# Patient Record
Sex: Female | Born: 1980 | Race: White | Hispanic: No | Marital: Single
Health system: Midwestern US, Academic
[De-identification: ages and names within clinical notes are randomized; demographics above are authoritative.]

---

## 2020-05-28 IMAGING — MR Knee^Routine
5 series · 39 of 40 positions shown · non-contrast
Comparison: none

[Series 3: T2 fat-sat · axial · 4.0mm · 0.50mm/px · z∈[-68,+33]mm · 5 of 6 slices shown (1 of 3)]
[im 1/6]
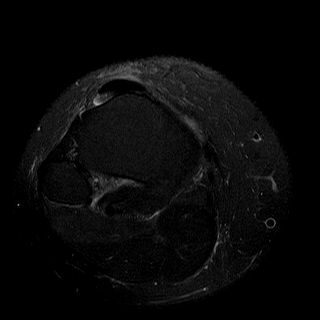
[im 2/6]
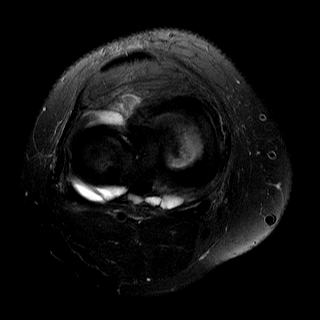
[im 3/6]
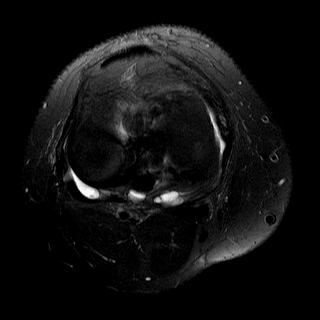
[im 4/6]
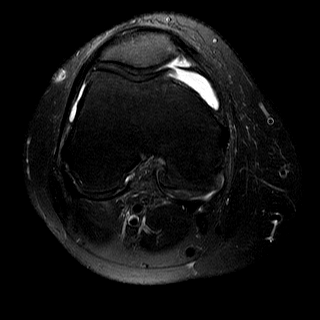
[im 6/6]
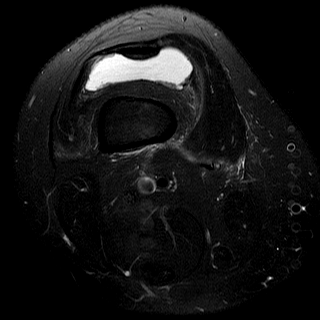

[Series 4: T1 · axial · 4.0mm · 0.62mm/px · z∈[-94,+38]mm · 9 of 12 slices shown]
[im 1/12]
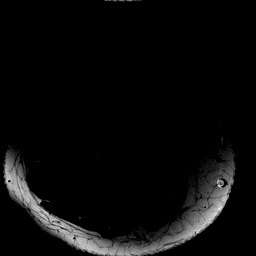
[im 2/12]
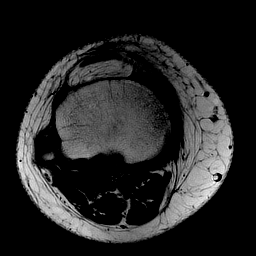
[im 3/12]
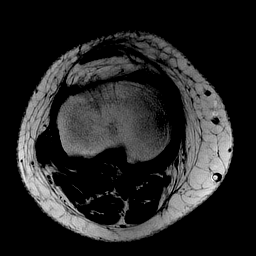
[im 4/12]
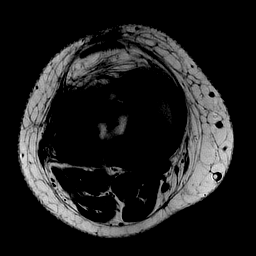
[im 5/12]
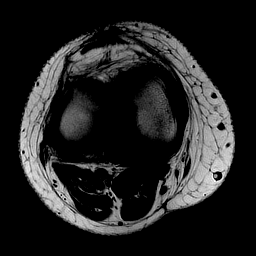
[im 7/12]
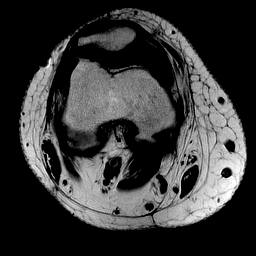
[im 8/12]
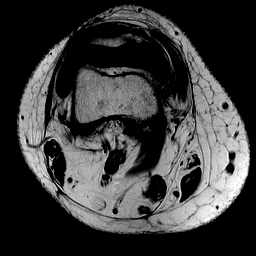
[im 10/12]
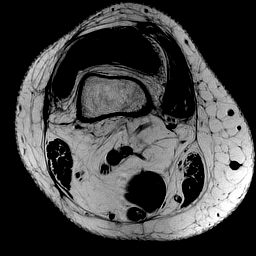
[im 12/12]
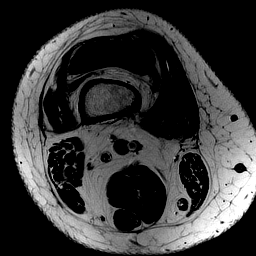

[Series 5: T2 fat-sat · coronal · 3.5mm · 0.50mm/px · 9 of 11 slices shown (2 of 3)]
[im 1/11]
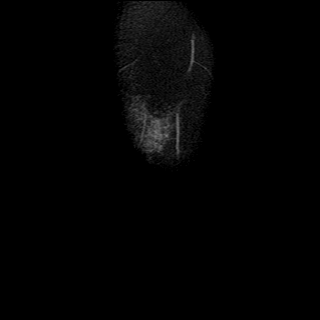
[im 2/11]
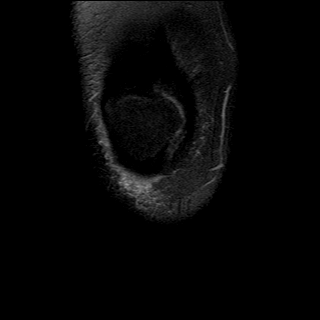
[im 3/11]
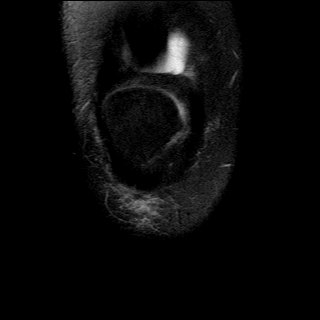
[im 4/11]
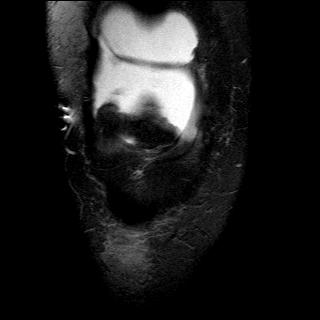
[im 6/11]
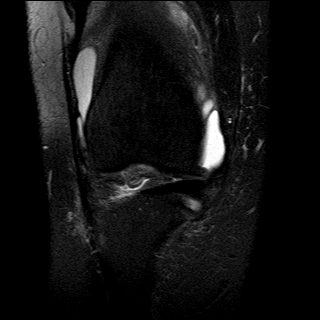
[im 7/11]
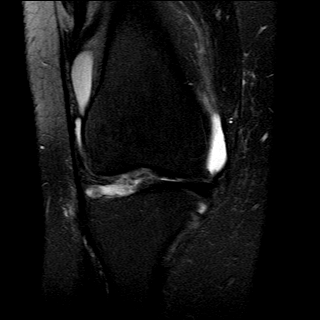
[im 8/11]
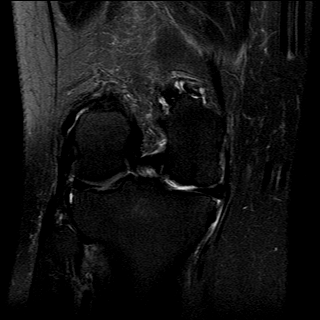
[im 9/11]
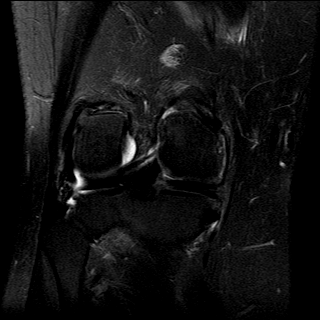
[im 11/11]
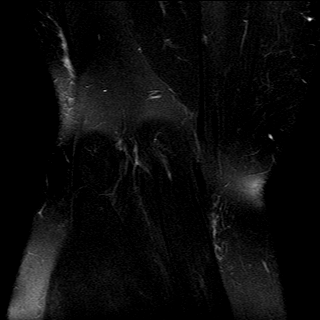

[Series 6: T2 fat-sat · sagittal · 4.0mm · 0.50mm/px · 8 of 9 slices shown (3 of 3)]
[im 1/9]
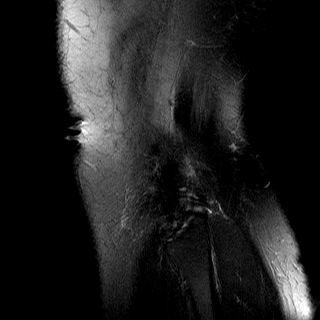
[im 2/9]
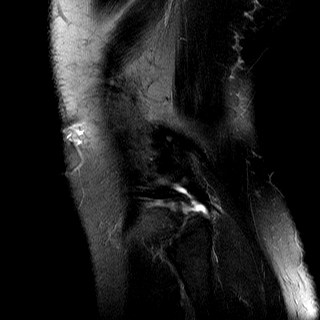
[im 3/9]
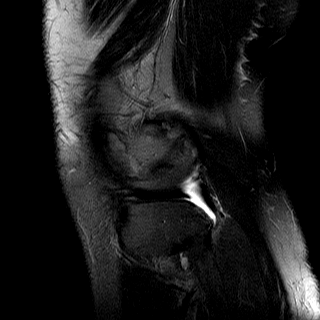
[im 4/9]
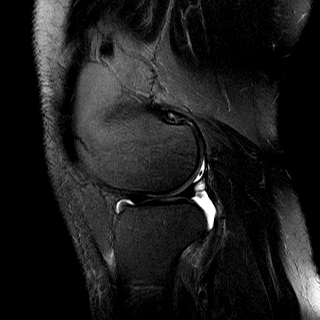
[im 5/9]
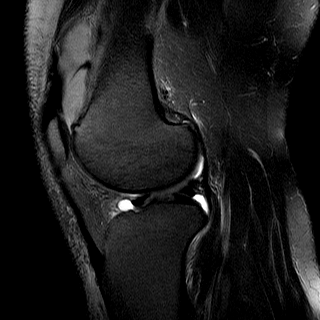
[im 6/9]
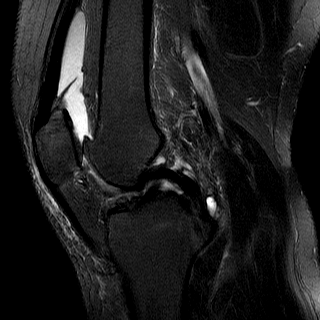
[im 7/9]
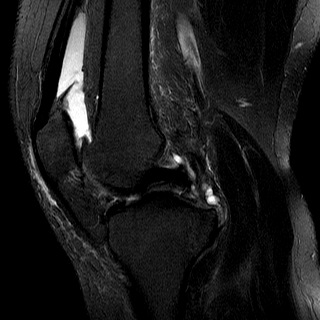
[im 9/9]
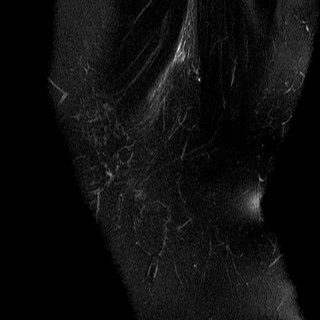

[Series 7: PD · sagittal · 4.0mm · 0.29mm/px · 8 of 10 slices shown]
[im 1/10]
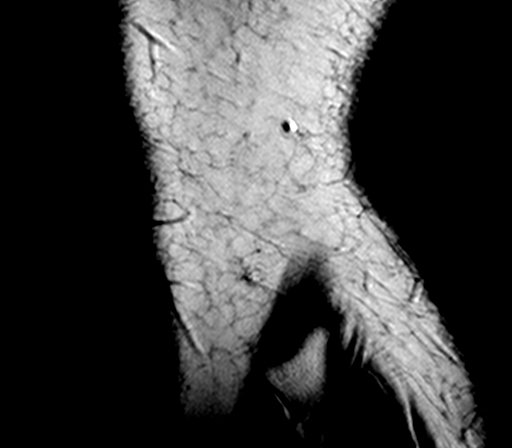
[im 2/10]
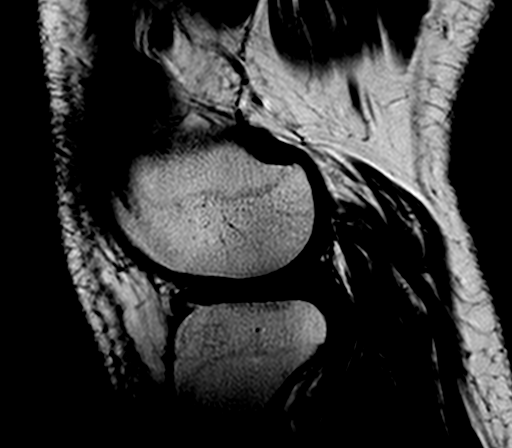
[im 3/10]
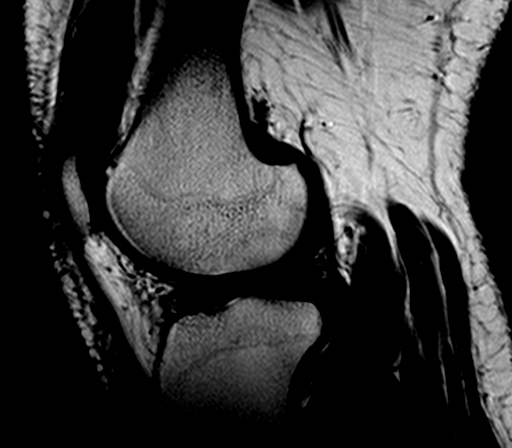
[im 4/10]
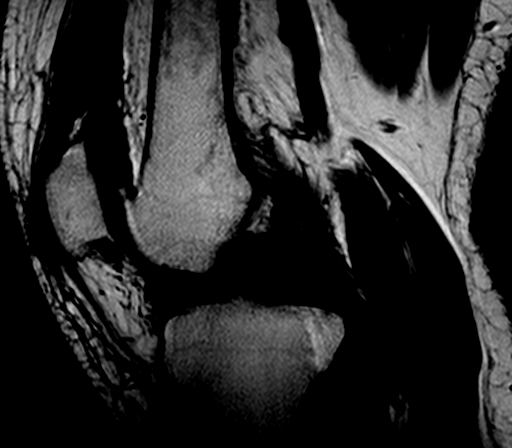
[im 6/10]
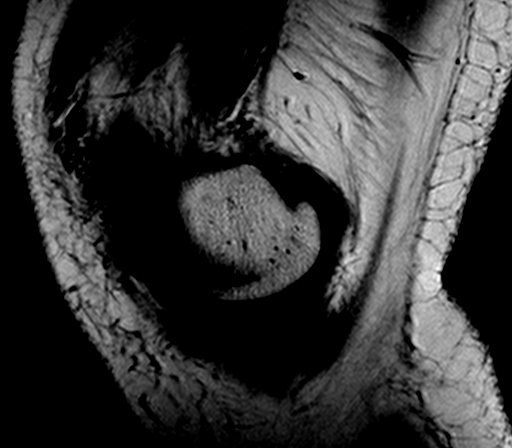
[im 7/10]
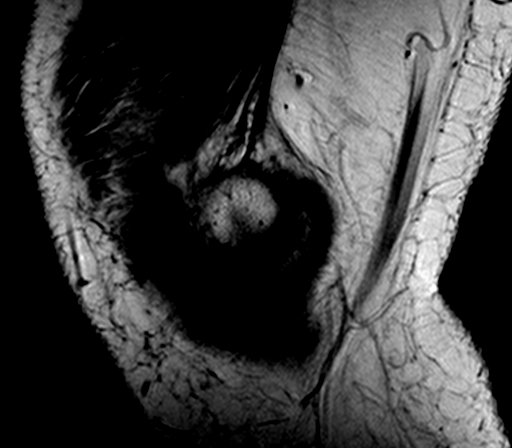
[im 8/10]
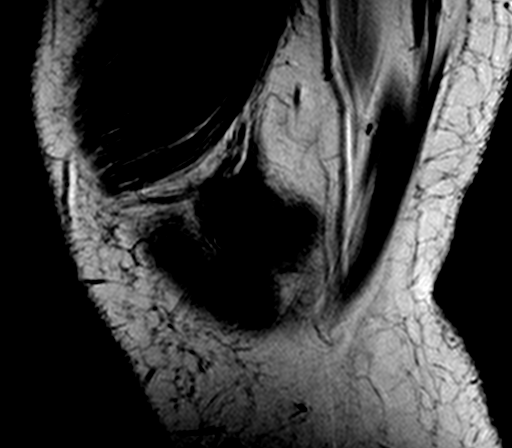
[im 10/10]
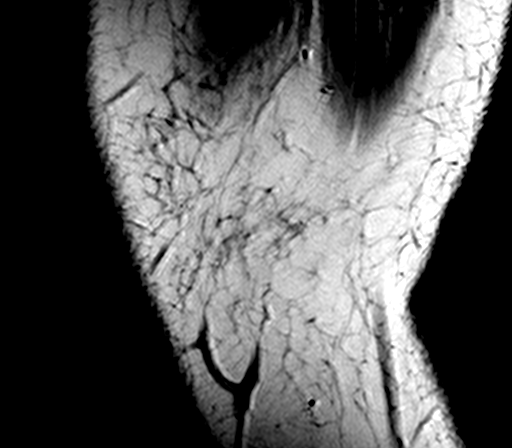

[39 of 40 positions shown; findings below may reference images not displayed]

EXAM

MR knee RT wo con

INDICATION

Knee pain

TECHNIQUE

MRI of the knee was performed. Sequences include coronal, sagittal, and axial planes with fast spin
echo technique, both in fat saturated and non-fat saturated. No intravenous contrast was
administered.

COMPARISONS

None available at the time of dictation.

FINDINGS

MEDIAL COMPARTMENT: Intact medial meniscus. Near full-thickness broad based cartilage loss at the
central weight-bearing medial femoral condyle without subchondral edema (series 6, image 18). Broad-
based near full-thickness cartilage loss of the medial tibial plateau.

LATERAL COMPARTMENT: Intact lateral meniscus. No focal chondrosis or subchondral edema.

PATELLOFEMORAL COMPARTMENT: Trace cartilage fibrillation over the lateral patellar facet.

CRUCIATE LIGAMENTS: Intact.

MEDIAL SUPPORTING STRUCTURES: Intact superficial and deep medial collateral ligaments.

LATERAL SUPPORTING STRUCTURES: Intact iliotibial band, lateral capsular ligament, lateral
collateral ligament, biceps femoris tendon, and popliteus tendon.

EXTENSOR MECHANISM: Intact.

JOINT SPACE/FLUID: Medium-sized knee joint effusion. Prominent superior plica. Trace amount of
possible synovitis in the suprapatellar recess. No Baker's cyst. Possible developing ganglion cyst
versus posterior medial joint recess distension measuring up to 2 cm (series 5, image 25).

BONES: No acute fracture, osseous contusion, or aggressive focal osseous lesion.

MUSCLES: Normal signal intensity and morphology.

NEUROVASCULAR: Normal popliteal neurovascular bundle.

IMPRESSION
1. Tricompartmental osteoarthrosis with medial weight-bearing predominant degenerative change.
2. Medium-sized knee joint effusion with internal synovitis.
3. No definitive meniscal or cruciate ligamentous injury.

Tech Notes:

RIGHT KNEE PAIN, S/P INJURY WHILE WALKING DOWN STAIRS IN February 2020, PT STATES SHE FELT POP IN KNEE
WHEN IT HAPPENED, LOCKS UP

## 2021-01-09 IMAGING — MR Knee^Routine
5 series · 20 of 20 positions shown · non-contrast
Comparison: none

[Series 3: T2 fat-sat · axial · 4.0mm · 0.50mm/px · 1 of 1 slices shown (1 of 2)]
[im 1/1]
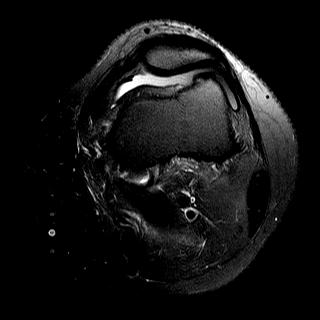

[Series 4: T1 · axial · 4.0mm · 0.62mm/px · z∈[-80,+20]mm · 4 of 4 slices shown]
[im 1/4]
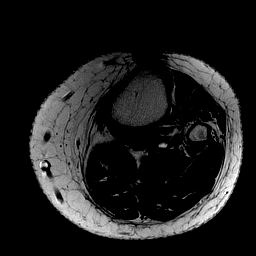
[im 2/4]
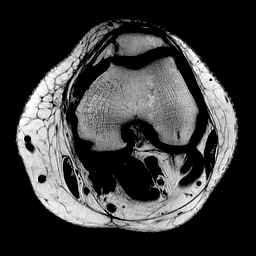
[im 3/4]
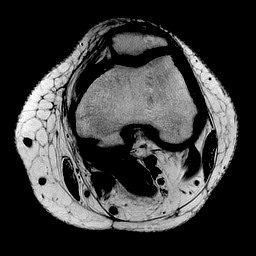
[im 4/4]
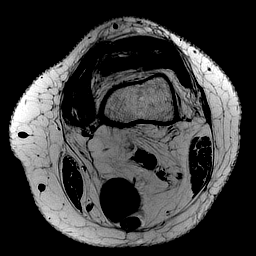

[Series 6: T2 fat-sat · sagittal · 4.0mm · 0.50mm/px · 7 of 7 slices shown (2 of 2)]
[im 1/7]
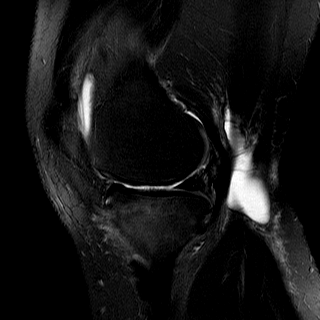
[im 2/7]
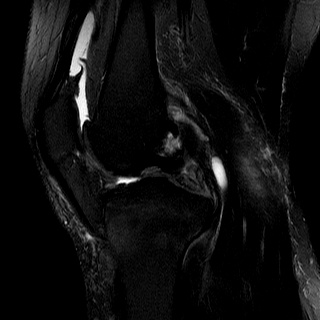
[im 3/7]
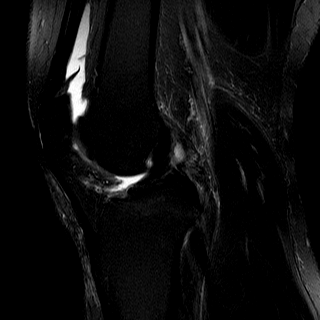
[im 4/7]
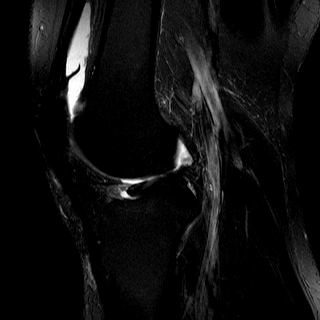
[im 5/7]
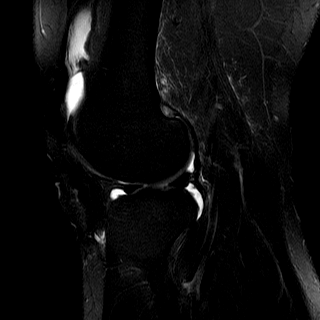
[im 6/7]
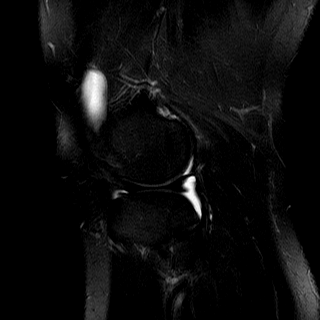
[im 7/7]
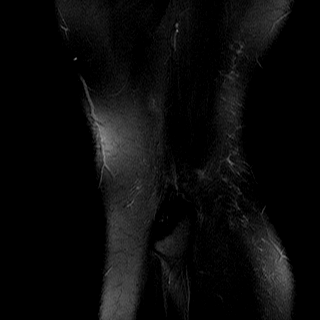

[Series 7: PD · sagittal · 4.0mm · 0.29mm/px · 7 of 7 slices shown]
[im 1/7]
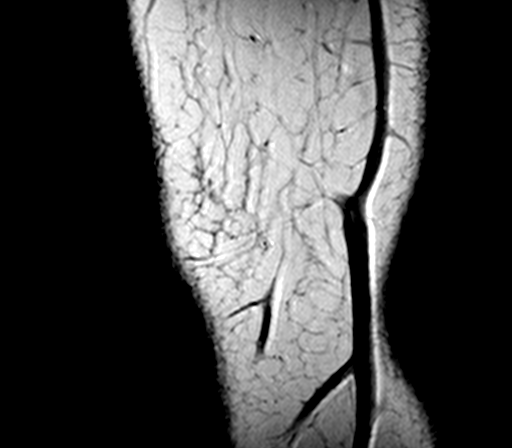
[im 2/7]
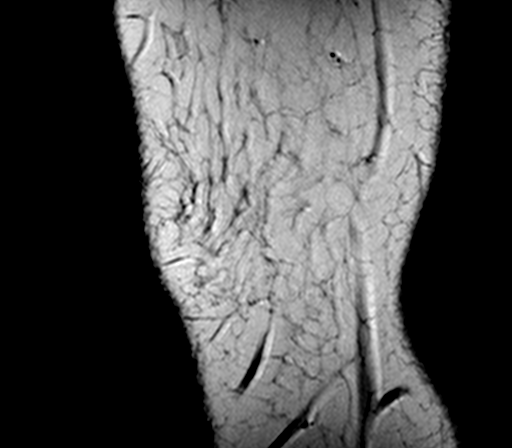
[im 3/7]
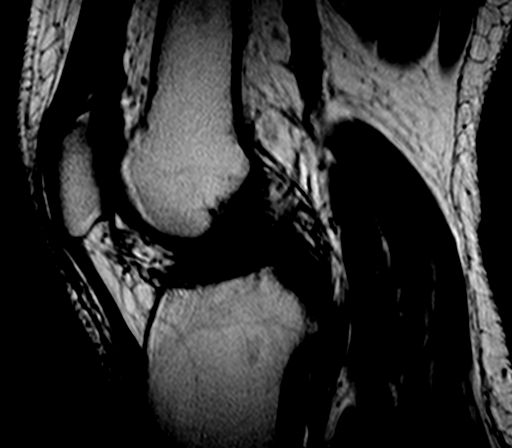
[im 4/7]
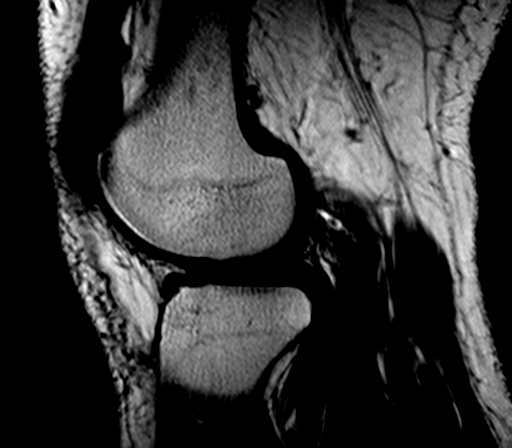
[im 5/7]
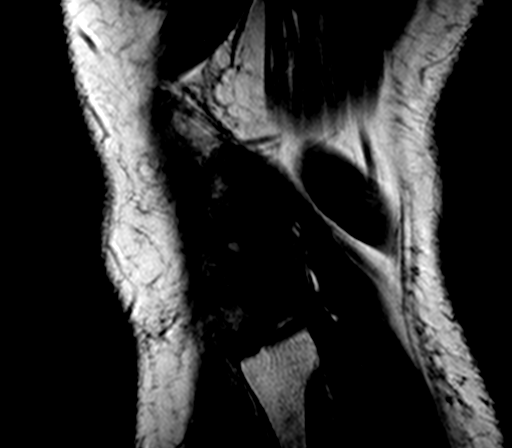
[im 6/7]
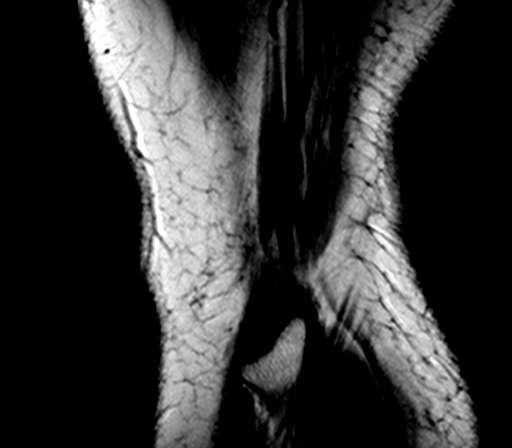
[im 7/7]
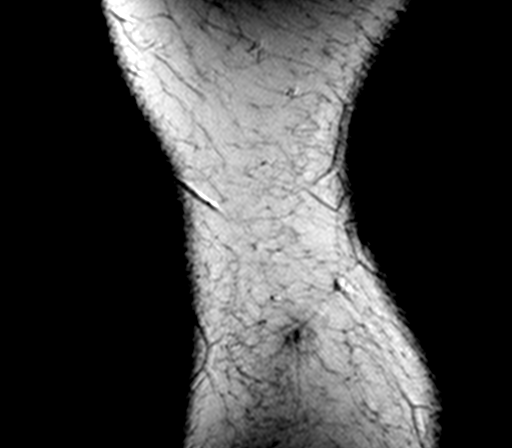

[Series 8: STIR · axial · 4.0mm · 0.29mm/px · 1 of 1 slices shown]
[im 1/1]
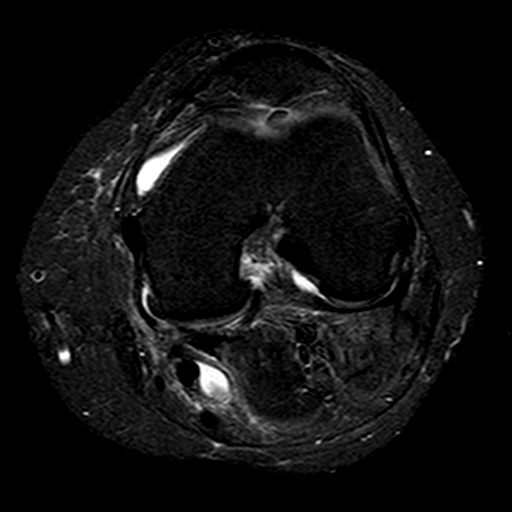

[20 of 20 positions shown; findings below may reference images not displayed]

EXAM

MR knee LT wo con

INDICATION

Knee pain

TECHNIQUE

MRI of the knee was performed. Sequences include coronal, sagittal, and axial planes with fast spin
echo technique, both in fat saturated and non-fat saturated. No intravenous contrast was
administered.

COMPARISONS

None available at the time of dictation.

FINDINGS

MEDIAL COMPARTMENT: Peripheral [DATE] posterior horn to posterior root tear (series 6, image 12).
Broad near full-thickness to full-thickness cartilage loss of the central weight-bearing medial
femoral condyle and opposing medial tibial plateau. Subchondral edema of the medial tibial plateau
with a small focal area of subchondral insufficiency fracture with T1 hypointense line (series 5,

LATERAL COMPARTMENT: Intact lateral meniscus. No focal chondrosis or subchondral edema.

PATELLOFEMORAL COMPARTMENT: No focal chondrosis or subchondral edema.

CRUCIATE LIGAMENTS: Intact.

MEDIAL SUPPORTING STRUCTURES: Intact superficial and deep medial collateral ligaments.

LATERAL SUPPORTING STRUCTURES: Intact iliotibial band, lateral capsular ligament, lateral
collateral ligament, biceps femoris tendon, and popliteus tendon.

EXTENSOR MECHANISM: Intact.

JOINT SPACE/FLUID: Small to medium knee joint effusion. Medium-sized Baker's cyst with slight
internal complexity. Soft tissue edema anterior to the anterior medial proximal tibia. This is
likely reactive.

BONES: Subchondral insufficiency fracture of the medial tibial plateau.

MUSCLES: Normal signal intensity and morphology.

NEUROVASCULAR: Normal popliteal neurovascular bundle.

IMPRESSION
1. Subchondral insufficiency fracture of the medial tibial plateau with surrounding soft tissue
edema and underlying proximal tibial bone marrow edema.
2. Medium-sized knee joint effusion.
3. Peripheral [DATE] posterior horn to posterior root tear of the medial meniscus.

Tech Notes:

POSTERIOR LEFT KNEE PAIN AFTER HEARING A POP 1 MONTHS AGO, NOW LATERAL KNEE PAIN.  RG

## 2021-10-01 ENCOUNTER — Encounter: Admit: 2021-10-01 | Discharge: 2021-10-01 | Payer: BC Managed Care – PPO

## 2021-10-02 ENCOUNTER — Encounter: Admit: 2021-10-02 | Discharge: 2021-10-02 | Payer: BC Managed Care – PPO

## 2021-10-02 DIAGNOSIS — M25561 Pain in right knee: Secondary | ICD-10-CM

## 2021-10-06 ENCOUNTER — Encounter: Admit: 2021-10-06 | Discharge: 2021-10-06 | Payer: BC Managed Care – PPO

## 2021-10-23 ENCOUNTER — Encounter: Admit: 2021-10-23 | Discharge: 2021-10-23 | Payer: BC Managed Care – PPO

## 2021-10-23 DIAGNOSIS — M25561 Pain in right knee: Secondary | ICD-10-CM

## 2021-10-26 ENCOUNTER — Encounter: Admit: 2021-10-26 | Discharge: 2021-10-26 | Payer: BC Managed Care – PPO

## 2021-10-27 ENCOUNTER — Encounter: Admit: 2021-10-27 | Discharge: 2021-10-27 | Payer: BC Managed Care – PPO

## 2021-10-27 ENCOUNTER — Ambulatory Visit: Admit: 2021-10-27 | Discharge: 2021-10-27 | Payer: BC Managed Care – PPO

## 2021-10-27 DIAGNOSIS — F32A Depression: Secondary | ICD-10-CM

## 2021-10-27 DIAGNOSIS — M25561 Pain in right knee: Secondary | ICD-10-CM

## 2021-10-27 DIAGNOSIS — E785 Hyperlipidemia, unspecified: Secondary | ICD-10-CM

## 2021-10-27 DIAGNOSIS — F419 Anxiety disorder, unspecified: Secondary | ICD-10-CM

## 2021-10-27 DIAGNOSIS — M94261 Chondromalacia, right knee: Secondary | ICD-10-CM

## 2021-10-27 DIAGNOSIS — G43909 Migraine, unspecified, not intractable, without status migrainosus: Secondary | ICD-10-CM

## 2021-10-27 DIAGNOSIS — R7303 Prediabetes: Secondary | ICD-10-CM

## 2021-10-27 DIAGNOSIS — G4700 Insomnia, unspecified: Secondary | ICD-10-CM

## 2021-10-27 DIAGNOSIS — M21161 Varus deformity, not elsewhere classified, right knee: Secondary | ICD-10-CM

## 2021-10-27 DIAGNOSIS — K219 Gastro-esophageal reflux disease without esophagitis: Secondary | ICD-10-CM

## 2021-11-06 ENCOUNTER — Encounter: Admit: 2021-11-06 | Discharge: 2021-11-06 | Payer: BC Managed Care – PPO

## 2021-11-11 ENCOUNTER — Encounter: Admit: 2021-11-11 | Discharge: 2021-11-11 | Payer: BC Managed Care – PPO

## 2021-11-17 ENCOUNTER — Encounter: Admit: 2021-11-17 | Discharge: 2021-11-17 | Payer: BC Managed Care – PPO

## 2021-11-17 ENCOUNTER — Ambulatory Visit: Admit: 2021-11-17 | Discharge: 2021-11-17 | Payer: BC Managed Care – PPO

## 2021-11-17 DIAGNOSIS — F419 Anxiety disorder, unspecified: Secondary | ICD-10-CM

## 2021-11-17 DIAGNOSIS — K219 Gastro-esophageal reflux disease without esophagitis: Secondary | ICD-10-CM

## 2021-11-17 DIAGNOSIS — E785 Hyperlipidemia, unspecified: Secondary | ICD-10-CM

## 2021-11-17 DIAGNOSIS — M94261 Chondromalacia, right knee: Secondary | ICD-10-CM

## 2021-11-17 DIAGNOSIS — R7303 Prediabetes: Secondary | ICD-10-CM

## 2021-11-17 DIAGNOSIS — G43909 Migraine, unspecified, not intractable, without status migrainosus: Secondary | ICD-10-CM

## 2021-11-17 DIAGNOSIS — F32A Depression: Secondary | ICD-10-CM

## 2021-11-17 DIAGNOSIS — G4700 Insomnia, unspecified: Secondary | ICD-10-CM

## 2021-11-17 DIAGNOSIS — M21161 Varus deformity, not elsewhere classified, right knee: Secondary | ICD-10-CM

## 2021-11-17 NOTE — Progress Notes
Patient arrived for scheduled appointment today for gel injections into her right knee. Clinic RN had called and left a voicemail to the number listed on her chart and left voicemail x2 about denial of Jcode medication for injection.   This RN went to the waiting room and explained in detail to patient about her denied injection through insurance. Patient was understandably upset. Offered patient the Euflexxa and Supartz patient pay options. Patient states that she can not afford those prices. She got very upset and states "someone is going to have replace my fucking knee then, I can not wait another 10 years for something to be done, I cant keep living in pain".  Patient states that she came down here for a second opinion since the other total joint surgeon refused to replace her knee as well.  Explained to the patient that working with a total joint surgeon the success rate of total knee replacements under the age of 66 is very minimal and explained all the risk of having a knee replacement at such young age. "Someone has to do fucking something because my insurance wont help me, you wont help me and I cant wait 10 years in this pain"   Explained to the patient that I was understanding of her pain and that I was very sorry that I could not offer any more help from my end as a nurse.  I offered for patient to still come back and talk with Dr. Essie Christine about options since her insurance had denied the injections.     This RN had a clinic sample for a single dose vial of the gel injection Durolane.  Patient advised that we could do a one time injection with a sample, but further injections would have to be patient pay.       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

## 2021-12-16 ENCOUNTER — Encounter: Admit: 2021-12-16 | Discharge: 2021-12-16 | Payer: BC Managed Care – PPO

## 2022-01-04 ENCOUNTER — Encounter: Admit: 2022-01-04 | Discharge: 2022-01-04 | Payer: BC Managed Care – PPO

## 2022-01-04 NOTE — Telephone Encounter
Dr. Essie Christine requested I reach out to patient for clarification on her pain since her appointment notes states shooting down leg. Called patient to see where and type of pain. Pt stated she has shooting pain down from her knee to foot. Reports some numbness and tingling and stated this may be some referred pain coming from back and Dr. Essie Christine recommends seeing spine. Pt then proceeded to get rude with the nurse stating it's not coming from her back she sees a spine specialist and she's had 2 back surgery and have been dealing with it for 12 years and she knows when it's her back. Asked what her goal of the appointment was since she just received an injection end of July- she then proceeding to state we need to do something about her knee and she can't keep living like this. She's been dealing with it for 30+ years and demands something be done. Pt stated the injections did not help longer than a week or two. This RN ended conversation due to patient being rude and not willing to listen.

## 2022-01-05 ENCOUNTER — Encounter: Admit: 2022-01-05 | Discharge: 2022-01-05 | Payer: BC Managed Care – PPO

## 2022-01-05 NOTE — Telephone Encounter
After discussion with Dr. Essie Christine he recommends patient get another opinion due to exhausting options and not a candidate for total knee at this time. Called patient to relay message and patient became very angry on phone and started yelling at this RN. Informed I was cancelling appointment for tomorrow due to not having any other options for her and his recommendations was a 2nd opinion. Pt proceeded to scream at this RN and then hang up. Dr. Essie Christine request patient be fired from his clinic after disrespectful behavior to staff.

## 2022-09-17 ENCOUNTER — Encounter: Admit: 2022-09-17 | Discharge: 2022-09-17 | Payer: BC Managed Care – PPO

## 2022-09-28 ENCOUNTER — Encounter: Admit: 2022-09-28 | Discharge: 2022-09-28 | Payer: No Typology Code available for payment source

## 2022-09-28 DIAGNOSIS — R053 Chronic cough: Secondary | ICD-10-CM

## 2022-12-01 ENCOUNTER — Encounter: Admit: 2022-12-01 | Discharge: 2022-12-01 | Payer: No Typology Code available for payment source

## 2022-12-01 ENCOUNTER — Ambulatory Visit: Admit: 2022-12-01 | Discharge: 2022-12-01 | Payer: No Typology Code available for payment source

## 2022-12-01 DIAGNOSIS — F419 Anxiety disorder, unspecified: Secondary | ICD-10-CM

## 2022-12-01 DIAGNOSIS — E785 Hyperlipidemia, unspecified: Secondary | ICD-10-CM

## 2022-12-01 DIAGNOSIS — G4700 Insomnia, unspecified: Secondary | ICD-10-CM

## 2022-12-01 DIAGNOSIS — J302 Other seasonal allergic rhinitis: Secondary | ICD-10-CM

## 2022-12-01 DIAGNOSIS — R7303 Prediabetes: Secondary | ICD-10-CM

## 2022-12-01 DIAGNOSIS — J479 Bronchiectasis, uncomplicated: Secondary | ICD-10-CM

## 2022-12-01 DIAGNOSIS — F32A Depression: Secondary | ICD-10-CM

## 2022-12-01 DIAGNOSIS — R053 Chronic cough: Secondary | ICD-10-CM

## 2022-12-01 DIAGNOSIS — G43909 Migraine, unspecified, not intractable, without status migrainosus: Secondary | ICD-10-CM

## 2022-12-01 DIAGNOSIS — K219 Gastro-esophageal reflux disease without esophagitis: Secondary | ICD-10-CM

## 2022-12-01 NOTE — Patient Instructions
Clinic Visit Summary:     Next clinic visit follow up with Dr Samuella Cota recommended in 3 months     CT Scan and Methacholine Challenge Test at next available.    Please contact Pulmonary Nurse Coordinator with signs and symptoms of worsening productive cough with thick secretions, blood in sputum, chest tightness/pain, shortness of breath, fever, chills, night sweats, or any questions or concerns.       For refills on medications, please have your pharmacy fax a refill authorization request form to our office at Fax) [phone]. Please allow at least 3 business days for refill requests.   For urgent issues after business hours/weekends/holidays call [phone] and request for the pulmonary fellow to be paged. For scheduling questions/concerns, please call [phone].

## 2023-01-18 ENCOUNTER — Encounter: Admit: 2023-01-18 | Discharge: 2023-01-18 | Payer: No Typology Code available for payment source

## 2023-03-14 ENCOUNTER — Encounter: Admit: 2023-03-14 | Discharge: 2023-03-14 | Payer: No Typology Code available for payment source

## 2023-03-29 ENCOUNTER — Encounter: Admit: 2023-03-29 | Discharge: 2023-03-29 | Payer: No Typology Code available for payment source

## 2023-04-08 ENCOUNTER — Encounter: Admit: 2023-04-08 | Discharge: 2023-04-08 | Payer: No Typology Code available for payment source

## 2023-05-05 ENCOUNTER — Encounter: Admit: 2023-05-05 | Discharge: 2023-05-05 | Payer: PRIVATE HEALTH INSURANCE

## 2023-05-06 ENCOUNTER — Encounter: Admit: 2023-05-06 | Discharge: 2023-05-06 | Payer: PRIVATE HEALTH INSURANCE

## 2023-05-30 ENCOUNTER — Encounter: Admit: 2023-05-30 | Discharge: 2023-05-30 | Payer: PRIVATE HEALTH INSURANCE

## 2023-07-04 ENCOUNTER — Encounter: Admit: 2023-07-04 | Discharge: 2023-07-04 | Payer: PRIVATE HEALTH INSURANCE

## 2023-07-08 ENCOUNTER — Encounter: Admit: 2023-07-08 | Discharge: 2023-07-08 | Payer: PRIVATE HEALTH INSURANCE

## 2023-07-08 ENCOUNTER — Ambulatory Visit: Admit: 2023-07-08 | Discharge: 2023-07-09 | Payer: PRIVATE HEALTH INSURANCE

## 2023-08-24 ENCOUNTER — Encounter: Admit: 2023-08-24 | Discharge: 2023-08-24 | Payer: PRIVATE HEALTH INSURANCE

## 2023-08-25 ENCOUNTER — Encounter: Admit: 2023-08-25 | Discharge: 2023-08-25

## 2023-12-10 ENCOUNTER — Encounter: Admit: 2023-12-10 | Discharge: 2023-12-10 | Payer: PRIVATE HEALTH INSURANCE

## 2023-12-14 ENCOUNTER — Encounter: Admit: 2023-12-14 | Discharge: 2023-12-14 | Payer: PRIVATE HEALTH INSURANCE

## 2023-12-21 ENCOUNTER — Encounter: Admit: 2023-12-21 | Discharge: 2023-12-21 | Payer: PRIVATE HEALTH INSURANCE

## 2024-01-25 ENCOUNTER — Ambulatory Visit: Admit: 2024-01-25 | Discharge: 2024-01-26 | Payer: PRIVATE HEALTH INSURANCE

## 2024-01-25 ENCOUNTER — Encounter: Admit: 2024-01-25 | Discharge: 2024-01-25 | Payer: PRIVATE HEALTH INSURANCE

## 2024-01-25 NOTE — Progress Notes
 ReSKU New Patient Note  Date of visit: 01/25/2024     History of Present Illness:   Michele Levine is a 43 y.o. female who was seen in consultation in the [facility] as a new patient for evaluation and possible treatment of urolithiasis. The most recent stone episode started on no recent episode and is ongoing. At that time, she was asymptomatic     The stone was diagnosed upon work up for back pain. The patient has had a CT abdomen/pelvis as part of their work up. Medical expulsive therapy was not attempted.     Stone Passage/Treament  -Outcome: the stone has not passed  -Treatment for this particular stone episode: no surgical intervention     Stone History  -Prior stone: stones since age 47-40  -Number of previous stone episodes: >=6  -Laterality: experienced stones on both sides  -Prior surgery: right ureteroscopy, left ureteroscopy, and left percutaneous nephrolothotomy     Risk Factors  -History of GI/Bariatric surgery: no history of GI/bariatric surgery  -Urinary reconstruction: no previous history of urinary reconstruction  -Anatomic anomalies: no urinary tract anomalies  -Family history of stones: no family history of stones     Today the patient is asymptomatic, good, well, denies flank pain, denies back pain, and denies hematuria. Pt complains also of LUTS especially push to urinate     Review of Systems: a complete ROS was performed and personally reviewed.   It is notable for:   Constitutional: Negative for fever, chills, weight loss and malaise/fatigue.   HENT: Negative for hearing loss, neck pain and tinnitus.    Eyes: Negative for blurred vision, double vision and photophobia.   Respiratory: Negative for cough, sputum production and wheezing.    Cardiovascular: Negative for chest pain and palpitations.   Gastrointestinal: Negative for heartburn, nausea, vomiting, abdominal pain, diarrhea and constipation.   Genitourinary: Negative for dysuria, urgency and frequency.   Musculoskeletal: Negative for back pain and joint pain.   Skin: Negative for itching and rash.   Neurological: Negative for dizziness, tingling, focal weakness and headaches.   Endo/Heme/Allergies: Does not bruise/bleed easily.   Psychiatric/Behavioral: Negative for depression. The patient is not nervous/anxious.        The patient's past medical, surgical, medication, allergy, family, and social histories were reviewed and noncontributory to this illness/condition except as noted below:     Past Medical History:  Past Medical History:    Anxiety    Arthritis    Borderline diabetes    Bronchiectasis (CMS-HCC)    Chronic kidney disease    Depression    GERD (gastroesophageal reflux disease)    Hyperlipemia    Insomnia    Kidney stones    Migraines    Seasonal allergic reaction    Thyroid disorder    Unspecified deficiency anemia        Past Surgical History:  Surgical History:   Procedure Laterality Date    CESAREAN SECTION      HX BACK SURGERY      KIDNEY STONE SURGERY      KNEE SURGERY Bilateral         Medications:   Current Medications[1]     Allergies:   Allergies[2]     Family History:  Family History   Problem Relation Name Age of Onset    Cancer Father steven Bandon Sherwin     Asthma Father steven Jodee Wagenaar     COPD Father steven Maddi Collar     Blood Clots Father steven Terrina Docter  Social History:  Social History     Socioeconomic History    Marital status: Single   Tobacco Use    Smoking status: Never    Smokeless tobacco: Never   Substance and Sexual Activity    Alcohol use: Yes     Comment: Every 2 yeras    Drug use: Never    Sexual activity: Not Currently     Partners: Female, Female     Birth control/protection: None        Objective   During the physical examination today, the patient's vital signs were  BP 101/71 (BP Source: Arm, Right Upper, Patient Position: Sitting)  - Pulse 79  - Temp 36.7 ?C (98 ?F) (Temporal)  - Ht 166.4 cm (5' 5.5)  - Wt 93.4 kg (206 lb)  - SpO2 97%  - BMI 33.76 kg/m?      General: Alert and oriented times three, pleasant, in no acute distress  HEENT: Normocephalic, atraumatic, moist mucous membranes, anicteric sclera  Musculoskeletal: No clubbing or cyanosis, neck exhibits normal range of motion  Cardiovascular: No pitting edema, radial pulses symmetric  Chest: Breathing is unlabored  Abdomen: Soft, non-tender, non-distended, no palpable masses  Genitourinary: No costovertebral tenderness,  Psychiatric: Mood and affect are normal, the patient is socially appropriate  Neurologic: Sensation is grossly intact in the upper and lower extremities      Imaging:   The following imaging was personally reviewed and the findings were discussed with the patient by me:CT abd/pelvis. This demonstrated No Hydroureteronephrosis and Stones right Kidney. Stones measured 0-5 mm.      01/25/2024     9:10 AM 12/01/2022     2:23 PM 10/27/2021    12:56 PM   Fatigue and Pain Assessment   Pain Score Zero Three Five   Pain Loc  KNEE KNEE   Pain Education  N        Lab Review: The following lab results were personally reviewed by me:  No results found for: WBC, HGB, HCT, MCV  No results found for: CA, PTH  No results found for: NA, K, CL, CO2  No results found for: CREATININE             It was a pleasure seeing Michele Levine in clinic today.      I addressed urolithiasis with the patient today. This issue is small upper pole kidney stone.      Based on today's visit, we will plan for observation.     Additional workup ordered today included refer to nephrologist for CKD management.      Rtc in 1 year with mason and new US     Refer to [facility] for LUTS eval.     Thank you for allowing us  to participate in the care of this patient. Please do not hesitate to contact us  should you have any questions or concerns.         [1]   Current Outpatient Medications   Medication Sig Dispense Refill    acyclovir (ZOVIRAX) 400 mg tablet       albuterol sulfate (PROAIR HFA) 90 mcg/actuation HFA aerosol inhaler Inhale two puffs by mouth into the lungs every 4 hours as needed.      atorvastatin (LIPITOR) 80 mg tablet       benzonatate (TESSALON) 200 mg capsule       clonazePAM (KLONOPIN) 0.5 mg tablet       cyclobenzaprine (FLEXERIL) 5 mg tablet  duloxetine DR (CYMBALTA) 60 mg capsule Take one capsule by mouth twice daily.      fenofibrate nanocrystallized (TRICOR) 145 mg tablet       gabapentin (NEURONTIN) 600 mg tablet       ipratropium bromide (ATROVENT) 21 mcg (0.03 %) nasal spray Apply two sprays into nose as directed every 12 hours.      lamoTRIgine (LAMICTAL) 150 mg tablet       metFORMIN-XR (GLUCOPHAGE XR) 500 mg extended release tablet Take one tablet by mouth daily with breakfast.      omeprazole DR (PRILOSEC) 20 mg capsule       ondansetron (ZOFRAN ODT) 4 mg rapid dissolve tablet       propranoloL (INDERAL) 10 mg tablet       QUEtiapine (SEROQUEL) 400 mg tablet       saccharomyces boulardii (FLORASTOR) 250 mg capsule Take one capsule by mouth twice daily.      SUMAtriptan succinate (IMITREX) 100 mg tablet Take one tablet by mouth DAILY PRN.      topiramate (TOPAMAX) 50 mg tablet Take one tablet by mouth twice daily.      zolpidem (AMBIEN) 5 mg tablet        No current facility-administered medications for this visit.   [2] No Known Allergies

## 2024-01-26 DIAGNOSIS — N200 Calculus of kidney: Secondary | ICD-10-CM

## 2024-02-22 ENCOUNTER — Encounter: Admit: 2024-02-22 | Discharge: 2024-02-22 | Payer: PRIVATE HEALTH INSURANCE

## 2024-04-20 ENCOUNTER — Encounter: Admit: 2024-04-20 | Discharge: 2024-04-20 | Payer: PRIVATE HEALTH INSURANCE

## 2024-05-03 ENCOUNTER — Encounter: Admit: 2024-05-03 | Discharge: 2024-05-03 | Payer: PRIVATE HEALTH INSURANCE

## 2024-05-03 NOTE — Telephone Encounter [36]
 Received call from patient who states she needs to be seen soon by Dr Gaylia. She states her heart has been checked out and is fine. Pt states when she coughs, she gets dizzy.     Returned call to patient and left VM requesting call back

## 2024-05-11 ENCOUNTER — Encounter: Admit: 2024-05-11 | Discharge: 2024-05-11 | Payer: PRIVATE HEALTH INSURANCE

## 2024-05-11 NOTE — Telephone Encounter [36]
 Pt called and LVM stating that she has had an increase in coughing fits. Pt reports that she coughs to the point that it makes her dizzy and SOB. Requesting call back to discuss. Pt wanting to get set up for an appt with out clinic soon.     RN called and spoke with pt. Pt states that she has had an increase in coughing fits. Pt reports that she coughs so much and so hard that she gets dizzy and feels like she might pass out. Pt reports it makes her SOB and takes awhile for her breathing to recover from the coughing fit. RN asked pt if she has had any infectious type symptoms. Pt reports that she has thick green phlegm but denies fevers. RN states he will pass this on to Dr. Gaylia when he gets back in the office on Monday and will let pt know his recommendations. Pt verbalized understanding and was appreciative of the call.

## 2024-05-14 ENCOUNTER — Encounter: Admit: 2024-05-14 | Discharge: 2024-05-14 | Payer: PRIVATE HEALTH INSURANCE

## 2024-05-14 NOTE — Telephone Encounter [36]
 Pt called and LVM stating that she is following up on conversation from last week regarding her cough symptoms and is wanting to know Dr. Beryle recommendations. Pt also sent a message through Mychart.

## 2024-05-16 ENCOUNTER — Encounter: Admit: 2024-05-16 | Discharge: 2024-05-16 | Payer: PRIVATE HEALTH INSURANCE
